# Patient Record
Sex: Male | Born: 1965 | Race: White | Hispanic: No | Marital: Single | Smoking: Current every day smoker
Health system: Southern US, Community
[De-identification: ages and names within clinical notes are randomized; demographics above are authoritative.]

## PROBLEM LIST (undated history)

## (undated) DIAGNOSIS — K7290 Hepatic failure, unspecified without coma: Secondary | ICD-10-CM

## (undated) DIAGNOSIS — I10 Essential (primary) hypertension: Secondary | ICD-10-CM

## (undated) DIAGNOSIS — B1920 Unspecified viral hepatitis C without hepatic coma: Secondary | ICD-10-CM

## (undated) DIAGNOSIS — G8929 Other chronic pain: Secondary | ICD-10-CM

## (undated) DIAGNOSIS — K7460 Unspecified cirrhosis of liver: Secondary | ICD-10-CM

## (undated) DIAGNOSIS — R188 Other ascites: Secondary | ICD-10-CM

---

## 2014-09-27 ENCOUNTER — Emergency Department (HOSPITAL_COMMUNITY)
Admission: EM | Admit: 2014-09-27 | Discharge: 2014-09-27 | Disposition: A | Discharge status: Home / Self Care | Payer: Medicare Other | Attending: Emergency Medicine | Admitting: Emergency Medicine

## 2014-09-27 ENCOUNTER — Emergency Department (INDEPENDENT_AMBULATORY_CARE_PROVIDER_SITE_OTHER)
Admission: EM | Admit: 2014-09-27 | Discharge: 2014-09-27 | Disposition: A | Discharge status: Home / Self Care | Payer: Medicare Other | Source: Home / Self Care | Attending: Family Medicine | Admitting: Family Medicine

## 2014-09-27 ENCOUNTER — Encounter (HOSPITAL_COMMUNITY): Payer: Self-pay | Admitting: Emergency Medicine

## 2014-09-27 ENCOUNTER — Encounter (HOSPITAL_COMMUNITY): Payer: Self-pay | Admitting: *Deleted

## 2014-09-27 ENCOUNTER — Emergency Department (HOSPITAL_COMMUNITY): Payer: Medicare Other

## 2014-09-27 DIAGNOSIS — R109 Unspecified abdominal pain: Secondary | ICD-10-CM

## 2014-09-27 DIAGNOSIS — Z79899 Other long term (current) drug therapy: Secondary | ICD-10-CM | POA: Diagnosis not present

## 2014-09-27 DIAGNOSIS — G8929 Other chronic pain: Secondary | ICD-10-CM

## 2014-09-27 DIAGNOSIS — R197 Diarrhea, unspecified: Secondary | ICD-10-CM | POA: Diagnosis not present

## 2014-09-27 DIAGNOSIS — K7460 Unspecified cirrhosis of liver: Secondary | ICD-10-CM | POA: Insufficient documentation

## 2014-09-27 DIAGNOSIS — K7290 Hepatic failure, unspecified without coma: Secondary | ICD-10-CM | POA: Insufficient documentation

## 2014-09-27 DIAGNOSIS — R1013 Epigastric pain: Secondary | ICD-10-CM

## 2014-09-27 DIAGNOSIS — I10 Essential (primary) hypertension: Secondary | ICD-10-CM

## 2014-09-27 DIAGNOSIS — F1193 Opioid use, unspecified with withdrawal: Secondary | ICD-10-CM

## 2014-09-27 DIAGNOSIS — F1123 Opioid dependence with withdrawal: Secondary | ICD-10-CM

## 2014-09-27 DIAGNOSIS — R188 Other ascites: Secondary | ICD-10-CM

## 2014-09-27 DIAGNOSIS — R112 Nausea with vomiting, unspecified: Secondary | ICD-10-CM

## 2014-09-27 DIAGNOSIS — B182 Chronic viral hepatitis C: Secondary | ICD-10-CM

## 2014-09-27 DIAGNOSIS — R1084 Generalized abdominal pain: Secondary | ICD-10-CM | POA: Insufficient documentation

## 2014-09-27 HISTORY — DX: Other chronic pain: G89.29

## 2014-09-27 HISTORY — DX: Essential (primary) hypertension: I10

## 2014-09-27 HISTORY — DX: Other ascites: R18.8

## 2014-09-27 HISTORY — DX: Unspecified viral hepatitis C without hepatic coma: B19.20

## 2014-09-27 HISTORY — DX: Hepatic failure, unspecified without coma: K72.90

## 2014-09-27 HISTORY — DX: Unspecified cirrhosis of liver: K74.60

## 2014-09-27 LAB — CBC WITH DIFFERENTIAL/PLATELET
Basophils Absolute: 0.1 10*3/uL (ref 0.0–0.1)
Basophils Relative: 0 % (ref 0–1)
EOS ABS: 0.2 10*3/uL (ref 0.0–0.7)
EOS PCT: 1 % (ref 0–5)
HCT: 45.6 % (ref 39.0–52.0)
HEMOGLOBIN: 15.8 g/dL (ref 13.0–17.0)
LYMPHS ABS: 4.5 10*3/uL — AB (ref 0.7–4.0)
Lymphocytes Relative: 30 % (ref 12–46)
MCH: 32 pg (ref 26.0–34.0)
MCHC: 34.6 g/dL (ref 30.0–36.0)
MCV: 92.5 fL (ref 78.0–100.0)
MONO ABS: 1.3 10*3/uL — AB (ref 0.1–1.0)
MONOS PCT: 8 % (ref 3–12)
NEUTROS ABS: 9.3 10*3/uL — AB (ref 1.7–7.7)
Neutrophils Relative %: 61 % (ref 43–77)
Platelets: 165 10*3/uL (ref 150–400)
RBC: 4.93 MIL/uL (ref 4.22–5.81)
RDW: 13.9 % (ref 11.5–15.5)
WBC: 15.4 10*3/uL — AB (ref 4.0–10.5)

## 2014-09-27 LAB — COMPREHENSIVE METABOLIC PANEL
ALBUMIN: 4.2 g/dL (ref 3.5–5.2)
ALT: 37 U/L (ref 0–53)
AST: 48 U/L — AB (ref 0–37)
Alkaline Phosphatase: 115 U/L (ref 39–117)
Anion gap: 7 (ref 5–15)
BILIRUBIN TOTAL: 1.5 mg/dL — AB (ref 0.3–1.2)
BUN: 10 mg/dL (ref 6–23)
CO2: 30 mmol/L (ref 19–32)
Calcium: 9.5 mg/dL (ref 8.4–10.5)
Chloride: 101 mEq/L (ref 96–112)
Creatinine, Ser: 0.83 mg/dL (ref 0.50–1.35)
GFR calc Af Amer: >90 mL/min (ref >90)
Glucose, Bld: 129 mg/dL — ABNORMAL HIGH (ref 70–99)
POTASSIUM: 4.7 mmol/L (ref 3.5–5.1)
Sodium: 138 mmol/L (ref 135–145)
TOTAL PROTEIN: 8.3 g/dL (ref 6.0–8.3)

## 2014-09-27 LAB — LIPASE, BLOOD: Lipase: 34 U/L (ref 11–59)

## 2014-09-27 MED ORDER — MORPHINE SULFATE 4 MG/ML IJ SOLN
4.0000 mg | Freq: Once | INTRAMUSCULAR | Status: AC
  Administered 2014-09-27: 4 mg via INTRAVENOUS
  Filled 2014-09-27: qty 1

## 2014-09-27 MED ORDER — AMLODIPINE BESYLATE 10 MG PO TABS: 10.0000 mg | ORAL_TABLET | Freq: Every day | ORAL | Status: DC

## 2014-09-27 MED ORDER — ONDANSETRON 4 MG PO TBDP: 4.0000 mg | ORAL_TABLET | Freq: Two times a day (BID) | ORAL | Status: DC | PRN

## 2014-09-27 MED ORDER — ONDANSETRON HCL 4 MG/2ML IJ SOLN
4.0000 mg | Freq: Once | INTRAMUSCULAR | Status: AC
  Administered 2014-09-27: 4 mg via INTRAVENOUS
  Filled 2014-09-27: qty 2

## 2014-09-27 MED ORDER — HYDROMORPHONE HCL 1 MG/ML IJ SOLN
1.0000 mg | Freq: Once | INTRAMUSCULAR | Status: AC
  Administered 2014-09-27: 1 mg via INTRAVENOUS
  Filled 2014-09-27: qty 1

## 2014-09-27 MED ORDER — IOHEXOL 300 MG/ML  SOLN
100.0000 mL | Freq: Once | INTRAMUSCULAR | Status: AC | PRN
  Administered 2014-09-27: 100 mL via INTRAVENOUS

## 2014-09-27 MED ORDER — OXYCODONE HCL 5 MG PO TABS: 10.0000 mg | ORAL_TABLET | Freq: Four times a day (QID) | ORAL | Status: AC | PRN
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
## 2014-09-27 NOTE — ED Notes (Signed)
Pt d/c wheelchair escorted to waiting room by this RN/
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
## 2014-09-27 NOTE — Discharge Instructions (Signed)
Thank you for coming in today.   Bellow is a list of pain management doctors.  Have your primary doctor contact and set up an appointment if possible.   [facility] 2 reviews  [facility] [address] [phone]) [phone]    [facility] 3 reviews  Pain Management Physician [address] [phone]   [facility] 4 reviews  [facility] Clinic [id number] [phone]) [phone]   [facility]: Hulda MarinPhillips Mark L MD No reviews  Pain Management Physician [address] [phone]) [phone]  [facility] 2 reviews  [facility] [address] [phone]    [facility] [place] No reviews  Pain Management Physician [address] [phone]   [facility] No reviews  Pain Control [address] [phone]   [facility] No reviews  Medical Center [address] [phone]) [phone]   [facility]: Callie FieldingBartko Albert MD 1 review  Doctor [address] [phone]   [facility] No reviews  [facility] [address] [phone]
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
## 2014-09-27 NOTE — ED Notes (Signed)
Pt. Requesting more pain medication. States morphine did not help. RN, Megan notified.

## 2014-09-27 NOTE — ED Notes (Signed)
Pt finished drinking oral contrast. CT notified.  

## 2014-09-27 NOTE — Discharge Instructions (Signed)
Abdominal (belly) pain can be caused by many things. Your caregiver performed an examination and possibly ordered blood/urine tests and imaging (CT scan, x-rays, ultrasound). Many cases can be observed and treated at home after initial evaluation in the emergency department. Even though you are being discharged home, abdominal pain can be unpredictable. Therefore, you need a repeated exam if your pain does not resolve, returns, or worsens. Most patients with abdominal pain don't have to be admitted to the hospital or have surgery, but serious problems like appendicitis and gallbladder attacks can start out as nonspecific pain. Many abdominal conditions cannot be diagnosed in one visit, so follow-up evaluations are very important. Your symptoms are likely from opiate withdrawal. Seek chronic pain management assistance using the resources given to you already. Use zofran as needed for nausea. Stay well hydrated. Use your amlodipine as directed. Call your regular doctor for an appointment this week to recheck. If you have worsening or changes in your symptoms, return to the ER. SEEK IMMEDIATE MEDICAL ATTENTION IF YOU DEVELOP ANY OF THE FOLLOWING SYMPTOMS:  The pain does not go away or becomes severe.   A temperature above 101 develops.   Repeated vomiting occurs (multiple episodes).   The pain becomes localized to portions of the abdomen. The right side could possibly be appendicitis. In an adult, the left lower portion of the abdomen could be colitis or diverticulitis.   Blood is being passed in stools or vomit (bright red or black tarry stools).   Return also if you develop chest pain, difficulty breathing, dizziness or fainting, or become confused, poorly responsive, or inconsolable (young children).  The constipation stays for more than 4 days.   There is belly (abdominal) or rectal pain.   You do not seem to be getting better.     Abdominal Pain Many things can cause belly (abdominal) pain.  Most times, the belly pain is not dangerous. Many cases of belly pain can be watched and treated at home. HOME CARE   Do not take medicines that help you go poop (laxatives) unless told to by your doctor.  Only take medicine as told by your doctor.  Eat or drink as told by your doctor. Your doctor will tell you if you should be on a special diet. GET HELP IF:  You do not know what is causing your belly pain.  You have belly pain while you are sick to your stomach (nauseous) or have runny poop (diarrhea).  You have pain while you pee or poop.  Your belly pain wakes you up at night.  You have belly pain that gets worse or better when you eat.  You have belly pain that gets worse when you eat fatty foods.  You have a fever. GET HELP RIGHT AWAY IF:   The pain does not go away within 2 hours.  You keep throwing up (vomiting).  The pain changes and is only in the right or left part of the belly.  You have bloody or tarry looking poop. MAKE SURE YOU:   Understand these instructions.  Will watch your condition.  Will get help right away if you are not doing well or get worse. Document Released: 02/25/2008 Document Revised: 09/13/2013 Document Reviewed: 05/18/2013 [facility] Patient Information 2015 [place], [place]. This information is not intended to replace advice given to you by your health care provider. Make sure you discuss any questions you have with your health care provider.  Cirrhosis Cirrhosis is a condition of scarring of the  liver which is caused when the liver has tried repairing itself following damage. This damage may come from a previous infection such as one of the forms of hepatitis (usually hepatitis C), or the damage may come from being injured by toxins. The main toxin that causes this damage is alcohol. The scarring of the liver from use of alcohol is irreversible. That means the liver cannot return to normal even though alcohol is not used any more. The main  danger of hepatitis C infection is that it may cause long-lasting (chronic) liver disease, and this also may lead to cirrhosis. This complication is progressive and irreversible. CAUSES  Prior to available blood tests, hepatitis C could be contracted by blood transfusions. Since testing of blood has improved, this is now unlikely. This infection can also be contracted through intravenous drug use and the sharing of needles. It can also be contracted through sexual relationships. The injury caused by alcohol comes from too much use. It is not a few drinks that poison the liver, but years of misuse. Usually there will be some signs and symptoms early with scarring of the liver that suggest the development of better habits. Alcohol should never be used while using acetaminophen. A small dose of both taken together may cause irreversible damage to the liver. HOME CARE INSTRUCTIONS  There is no specific treatment for cirrhosis. However, there are things you can do to avoid making the condition worse.  Rest as needed.  Eat a well-balanced diet. Your caregiver can help you with suggestions.  Vitamin supplements including vitamins A, K, D, and thiamine can help.  A low-salt diet, water restriction, or diuretic medicine may be needed to reduce fluid retention.  Avoid alcohol. This can be extremely toxic if combined with acetaminophen.  Avoid drugs which are toxic to the liver. Some of these include isoniazid, methyldopa, acetaminophen, anabolic steroids (muscle-building drugs), erythromycin, and oral contraceptives (birth control pills). Check with your caregiver to make sure medicines you are presently taking will not be harmful.  Periodic blood tests may be required. Follow your caregiver's advice regarding the timing of these.  Milk thistle is an herbal remedy which does protect the liver against toxins. However, it will not help once the liver has been scarred. SEEK MEDICAL CARE IF:  You have  increasing fatigue or weakness.  You develop swelling of the hands, feet, legs, or face.  You vomit bright red blood, or a coffee ground appearing material.  You have blood in your stools, or the stools turn black and tarry.  You have a fever.  You develop loss of appetite, or have nausea and vomiting.  You develop jaundice.  You develop easy bruising or bleeding.  You have worsening of any of the problems you are concerned about. Document Released: 09/08/2005 Document Revised: 12/01/2011 Document Reviewed: 04/26/2008 [facility] Patient Information 2015 [place], [place]. This information is not intended to replace advice given to you by your health care provider. Make sure you discuss any questions you have with your health care provider.  Diarrhea Diarrhea is frequent loose and watery bowel movements. It can cause you to feel weak and dehydrated. Dehydration can cause you to become tired and thirsty, have a dry mouth, and have decreased urination that often is dark yellow. Diarrhea is a sign of another problem, most often an infection that will not last long. In most cases, diarrhea typically lasts 2-3 days. However, it can last longer if it is a sign of something more serious. It  is important to treat your diarrhea as directed by your caregiver to lessen or prevent future episodes of diarrhea. CAUSES  Some common causes include:  Gastrointestinal infections caused by viruses, bacteria, or parasites.  Food poisoning or food allergies.  Certain medicines, such as antibiotics, chemotherapy, and laxatives.  Artificial sweeteners and fructose.  Digestive disorders. HOME CARE INSTRUCTIONS  Ensure adequate fluid intake (hydration): Have 1 cup (8 oz) of fluid for each diarrhea episode. Avoid fluids that contain simple sugars or sports drinks, fruit juices, whole milk products, and sodas. Your urine should be clear or pale yellow if you are drinking enough fluids. Hydrate with an oral  rehydration solution that you can purchase at pharmacies, retail stores, and online. You can prepare an oral rehydration solution at home by mixing the following ingredients together:   - tsp table salt.   tsp baking soda.   tsp salt substitute containing potassium chloride.  1  tablespoons sugar.  1 L (34 oz) of water.  Certain foods and beverages may increase the speed at which food moves through the gastrointestinal (GI) tract. These foods and beverages should be avoided and include:  Caffeinated and alcoholic beverages.  High-fiber foods, such as raw fruits and vegetables, nuts, seeds, and whole grain breads and cereals.  Foods and beverages sweetened with sugar alcohols, such as xylitol, sorbitol, and mannitol.  Some foods may be well tolerated and may help thicken stool including:  Starchy foods, such as rice, toast, pasta, low-sugar cereal, oatmeal, grits, baked potatoes, crackers, and bagels.  Bananas.  Applesauce.  Add probiotic-rich foods to help increase healthy bacteria in the GI tract, such as yogurt and fermented milk products.  Wash your hands well after each diarrhea episode.  Only take over-the-counter or prescription medicines as directed by your caregiver.  Take a warm bath to relieve any burning or pain from frequent diarrhea episodes. SEEK IMMEDIATE MEDICAL CARE IF:   You are unable to keep fluids down.  You have persistent vomiting.  You have blood in your stool, or your stools are black and tarry.  You do not urinate in 6-8 hours, or there is only a small amount of very dark urine.  You have abdominal pain that increases or localizes.  You have weakness, dizziness, confusion, or light-headedness.  You have a severe headache.  Your diarrhea gets worse or does not get better.  You have a fever or persistent symptoms for more than 2-3 days.  You have a fever and your symptoms suddenly get worse. MAKE SURE YOU:   Understand these  instructions.  Will watch your condition.  Will get help right away if you are not doing well or get worse. Document Released: 08/29/2002 Document Revised: 01/23/2014 Document Reviewed: 05/16/2012 [facility] Patient Information 2015 [place], [place]. This information is not intended to replace advice given to you by your health care provider. Make sure you discuss any questions you have with your health care provider.  Food Choices to Help Relieve Diarrhea When you have diarrhea, the foods you eat and your eating habits are very important. Choosing the right foods and drinks can help relieve diarrhea. Also, because diarrhea can last up to 7 days, you need to replace lost fluids and electrolytes (such as sodium, potassium, and chloride) in order to help prevent dehydration.  WHAT GENERAL GUIDELINES DO I NEED TO FOLLOW?  Slowly drink 1 cup (8 oz) of fluid for each episode of diarrhea. If you are getting enough fluid, your urine will be clear  or pale yellow.  Eat starchy foods. Some good choices include white rice, white toast, pasta, low-fiber cereal, baked potatoes (without the skin), saltine crackers, and bagels.  Avoid large servings of any cooked vegetables.  Limit fruit to two servings per day. A serving is  cup or 1 small piece.  Choose foods with less than 2 g of fiber per serving.  Limit fats to less than 8 tsp (38 g) per day.  Avoid fried foods.  Eat foods that have probiotics in them. Probiotics can be found in certain dairy products.  Avoid foods and beverages that may increase the speed at which food moves through the stomach and intestines (gastrointestinal tract). Things to avoid include:  High-fiber foods, such as dried fruit, raw fruits and vegetables, nuts, seeds, and whole grain foods.  Spicy foods and high-fat foods.  Foods and beverages sweetened with high-fructose corn syrup, honey, or sugar alcohols such as xylitol, sorbitol, and mannitol. WHAT FOODS ARE  RECOMMENDED? Grains White rice. White, [place], or pita breads (fresh or toasted), including plain rolls, buns, or bagels. White pasta. Saltine, soda, or graham crackers. Pretzels. Low-fiber cereal. Cooked cereals made with water (such as cornmeal, farina, or cream cereals). Plain muffins. Matzo. Melba toast. Zwieback.  Vegetables Potatoes (without the skin). Strained tomato and vegetable juices. Most well-cooked and canned vegetables without seeds. Tender lettuce. Fruits Cooked or canned applesauce, apricots, cherries, fruit cocktail, grapefruit, peaches, pears, or plums. Fresh bananas, apples without skin, cherries, grapes, cantaloupe, grapefruit, peaches, oranges, or plums.  Meat and Other Protein Products Baked or boiled chicken. Eggs. Tofu. Fish. Seafood. Smooth peanut butter. Ground or well-cooked tender beef, ham, veal, lamb, pork, or poultry.  Dairy Plain yogurt, kefir, and unsweetened liquid yogurt. Lactose-free milk, buttermilk, or soy milk. Plain hard cheese. Beverages Sport drinks. Clear broths. Diluted fruit juices (except prune). Regular, caffeine-free sodas such as ginger ale. Water. Decaffeinated teas. Oral rehydration solutions. Sugar-free beverages not sweetened with sugar alcohols. Other Bouillon, broth, or soups made from recommended foods.  The items listed above may not be a complete list of recommended foods or beverages. Contact your dietitian for more options. WHAT FOODS ARE NOT RECOMMENDED? Grains Whole grain, whole wheat, bran, or rye breads, rolls, pastas, crackers, and cereals. Wild or brown rice. Cereals that contain more than 2 g of fiber per serving. Corn tortillas or taco shells. Cooked or dry oatmeal. Granola. Popcorn. Vegetables Raw vegetables. Cabbage, broccoli, Brussels sprouts, artichokes, baked beans, beet greens, corn, kale, legumes, peas, sweet potatoes, and yams. Potato skins. Cooked spinach and cabbage. Fruits Dried fruit, including raisins and dates.  Raw fruits. Stewed or dried prunes. Fresh apples with skin, apricots, mangoes, pears, raspberries, and strawberries.  Meat and Other Protein Products Chunky peanut butter. Nuts and seeds. Beans and lentils. Tomasa BlaseBacon.  Dairy High-fat cheeses. Milk, chocolate milk, and beverages made with milk, such as milk shakes. Cream. Ice cream. Sweets and Desserts Sweet rolls, doughnuts, and sweet breads. Pancakes and waffles. Fats and Oils Butter. Cream sauces. Margarine. Salad oils. Plain salad dressings. Olives. Avocados.  Beverages Caffeinated beverages (such as coffee, tea, soda, or energy drinks). Alcoholic beverages. Fruit juices with pulp. Prune juice. Soft drinks sweetened with high-fructose corn syrup or sugar alcohols. Other Coconut. Hot sauce. Chili powder. Mayonnaise. Gravy. Cream-based or milk-based soups.  The items listed above may not be a complete list of foods and beverages to avoid. Contact your dietitian for more information. WHAT SHOULD I DO IF I BECOME DEHYDRATED? Diarrhea can sometimes lead  to dehydration. Signs of dehydration include dark urine and dry mouth and skin. If you think you are dehydrated, you should rehydrate with an oral rehydration solution. These solutions can be purchased at pharmacies, retail stores, or online.  Drink -1 cup (120-240 mL) of oral rehydration solution each time you have an episode of diarrhea. If drinking this amount makes your diarrhea worse, try drinking smaller amounts more often. For example, drink 1-3 tsp (5-15 mL) every 5-10 minutes.  A general rule for staying hydrated is to drink 1-2 L of fluid per day. Talk to your health care provider about the specific amount you should be drinking each day. Drink enough fluids to keep your urine clear or pale yellow. Document Released: 11/29/2003 Document Revised: 09/13/2013 Document Reviewed: 08/01/2013 [facility] Patient Information 2015 [place], [place]. This information is not intended to replace advice given  to you by your health care provider. Make sure you discuss any questions you have with your health care provider.  Opioid Withdrawal Opioids are a group of narcotic drugs. They include the Zoanne Newill drug heroin. They also include pain medicines, such as morphine, hydrocodone, oxycodone, and fentanyl. Opioid withdrawal is a group of characteristic physical and mental signs and symptoms. It typically occurs if you have been using opioids daily for several weeks or longer and stop using or rapidly decrease use. Opioid withdrawal can also occur if you have used opioids daily for a long time and are given a medicine to block the effect.  SIGNS AND SYMPTOMS Opioid withdrawal includes three or more of the following symptoms:   Depressed, anxious, or irritable mood.  Nausea or vomiting.  Muscle aches or spasms.   Watery eyes.   Runny nose.  Dilated pupils, sweating, or hairs standing on end.  Diarrhea or intestinal cramping.  Yawning.   Fever.  Increased blood pressure.  Fast pulse.  Restlessness or trouble sleeping. These signs and symptoms occur within several hours of stopping or reducing short-acting opioids, such as heroin. They can occur within 3 days of stopping or reducing long-acting opioids, such as methadone. Withdrawal begins within minutes of receiving a drug that blocks the effects of opioids, such as naltrexone or naloxone. DIAGNOSIS  Opioid use disorder is diagnosed by your health care provider. You will be asked about your symptoms, drug and alcohol use, medical history, and use of medicines. A physical exam may be done. Lab tests may be ordered. Your health care provider may have you see a mental health professional.  TREATMENT  The treatment for opioid withdrawal is usually provided by medical doctors with special training in substance use disorders (addiction specialists). The following medicines may be included in treatment:  Opioids given in place of the abused  opioid. They turn on opioid receptors in the brain and lessen or prevent withdrawal symptoms. They are gradually decreased (opioid substitution and taper).  Non-opioids that can lessen certain opioid withdrawal symptoms. They may be used alone or with opioid substitution and taper. Successful long-term recovery usually requires medicine, counseling, and group support. HOME CARE INSTRUCTIONS   Take medicines only as directed by your health care provider.  Check with your health care provider before starting new medicines.  Keep all follow-up visits as directed by your health care provider. SEEK MEDICAL CARE IF:  You are not able to take your medicines as directed.  Your symptoms get worse.  You relapse. SEEK IMMEDIATE MEDICAL CARE IF:  You have serious thoughts about hurting yourself or others.  You have a  seizure.  You lose consciousness. Document Released: 09/11/2003 Document Revised: 01/23/2014 Document Reviewed: 09/21/2013 [facility] Patient Information 2015 [place], [place]. This information is not intended to replace advice given to you by your health care provider. Make sure you discuss any questions you have with your health care provider.

## 2014-09-27 NOTE — ED Notes (Signed)
Pt in c/o abd pain that is chronic, states he has had trouble with it for 20 years- a few days ago he had to leave home without his normal medications which caused increased pain, no distress noted
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
## 2014-10-30 ENCOUNTER — Encounter: Payer: Self-pay | Admitting: Family Medicine

## 2014-10-30 ENCOUNTER — Ambulatory Visit (INDEPENDENT_AMBULATORY_CARE_PROVIDER_SITE_OTHER): Payer: Medicare Other | Admitting: Family Medicine

## 2014-10-30 VITALS — BP 148/79 | HR 90 | Temp 98.1°F | Ht 72.0 in | Wt 278.0 lb

## 2014-10-30 DIAGNOSIS — R7309 Other abnormal glucose: Secondary | ICD-10-CM

## 2014-10-30 DIAGNOSIS — Z0000 Encounter for general adult medical examination without abnormal findings: Secondary | ICD-10-CM

## 2014-10-30 DIAGNOSIS — Z720 Tobacco use: Secondary | ICD-10-CM | POA: Insufficient documentation

## 2014-10-30 DIAGNOSIS — R739 Hyperglycemia, unspecified: Secondary | ICD-10-CM

## 2014-10-30 DIAGNOSIS — R600 Localized edema: Secondary | ICD-10-CM

## 2014-10-30 DIAGNOSIS — K219 Gastro-esophageal reflux disease without esophagitis: Secondary | ICD-10-CM

## 2014-10-30 DIAGNOSIS — G4733 Obstructive sleep apnea (adult) (pediatric): Secondary | ICD-10-CM

## 2014-10-30 DIAGNOSIS — I10 Essential (primary) hypertension: Secondary | ICD-10-CM | POA: Insufficient documentation

## 2014-10-30 DIAGNOSIS — M1990 Unspecified osteoarthritis, unspecified site: Secondary | ICD-10-CM

## 2014-10-30 DIAGNOSIS — R7303 Prediabetes: Secondary | ICD-10-CM

## 2014-10-30 LAB — COMPREHENSIVE METABOLIC PANEL
ALBUMIN: 4.5 g/dL (ref 3.5–5.2)
ALT: 40 U/L (ref 0–53)
AST: 51 U/L — ABNORMAL HIGH (ref 0–37)
Alkaline Phosphatase: 96 U/L (ref 39–117)
BILIRUBIN TOTAL: 0.7 mg/dL (ref 0.2–1.2)
BUN: 19 mg/dL (ref 6–23)
CO2: 30 mEq/L (ref 19–32)
Calcium: 10.2 mg/dL (ref 8.4–10.5)
Chloride: 98 mEq/L (ref 96–112)
Creat: 0.85 mg/dL (ref 0.50–1.35)
Glucose, Bld: 125 mg/dL — ABNORMAL HIGH (ref 70–99)
POTASSIUM: 5.3 meq/L (ref 3.5–5.3)
Sodium: 135 mEq/L (ref 135–145)
TOTAL PROTEIN: 8.2 g/dL (ref 6.0–8.3)

## 2014-10-30 LAB — CBC
HEMATOCRIT: 48.9 % (ref 39.0–52.0)
HEMOGLOBIN: 17 g/dL (ref 13.0–17.0)
MCH: 31.5 pg (ref 26.0–34.0)
MCHC: 34.8 g/dL (ref 30.0–36.0)
MCV: 90.6 fL (ref 78.0–100.0)
MPV: 11.4 fL (ref 8.6–12.4)
Platelets: 198 10*3/uL (ref 150–400)
RBC: 5.4 MIL/uL (ref 4.22–5.81)
RDW: 14.2 % (ref 11.5–15.5)
WBC: 13.7 10*3/uL — ABNORMAL HIGH (ref 4.0–10.5)

## 2014-10-30 LAB — POCT GLYCOSYLATED HEMOGLOBIN (HGB A1C): Hemoglobin A1C: 5.6

## 2014-10-30 LAB — LIPID PANEL
CHOL/HDL RATIO: 3.4 ratio
CHOLESTEROL: 140 mg/dL (ref 0–200)
HDL: 41 mg/dL (ref >39)
LDL Cholesterol: 78 mg/dL (ref 0–99)
Triglycerides: 107 mg/dL (ref <150)
VLDL: 21 mg/dL (ref 0–40)

## 2014-10-30 MED ORDER — SPIRONOLACTONE 25 MG PO TABS: 25.0000 mg | ORAL_TABLET | Freq: Every day | ORAL | Status: DC

## 2014-10-30 NOTE — Assessment & Plan Note (Signed)
A: Likely related to patient's cirrhosis, though can also consider CHF (JVD on exam and PND, but no orthopnea), or side effect of amlodipine.   P: Will refer to cardiology for echocardiogram to rule out CHF. Will additional start spironolactone today. Will check CMP today and recheck potassium in 2 weeks. If not improving, can consider changing amlodipine to other antihypertensive.

## 2014-10-30 NOTE — Assessment & Plan Note (Signed)
Will re-address at future appointment and discuss referral for sleep study.

## 2014-10-30 NOTE — Progress Notes (Signed)
Patrick Henderson is a 49 y.o. male who presents to the [facility] today to establish care. His concerns today include:  HPI:  Bilateral Leg Swelling Started around a year ago, was previously told it was related to his cirrhosis. Has worsened over the past year. Tried propping up feet, which has helped some. Has been on lasix in the past, but does not remember how much or if it helped with his swelling. No orthopnea. Sometimes wakes up in the middle of the night gasping for air, though attributes it to sleep apnea (see below problem). No cough. No chest pain.  OSA. No prior sleep study performed. Patient states that he feels unrested throughout the day. Wakes up in the middle of the night gasping for air.  Tobacco Abuse: Current smoker, down to 2 cigarettes per day. Actively trying to quit.   Hypertension BP Readings from Last 3 Encounters:  10/30/14 148/79  09/27/14 162/89  09/27/14 179/134   Home BP monitoring-No Compliant with medications-yes without side effects ROS-Denies any CP, HA, SOB, blurry vision, transient weakness, orthopnea,    ROS: As per HPI, otherwise all systems reviewed and are negative.  Past Medical History - Reviewed and updated Patient Active Problem List   Diagnosis Date Noted  . Arthritis 10/30/2014  . Prediabetes 10/30/2014  . GERD (gastroesophageal reflux disease) 10/30/2014  . Hypertension 10/30/2014  . Obstructive sleep apnea 10/30/2014  . Healthcare maintenance 10/30/2014  . Tobacco abuse 10/30/2014  . Bilateral leg edema 10/30/2014  . Hepatic cirrhosis due to chronic hepatitis C infection 09/27/2014  . Chronic pain 09/27/2014    Medications- reviewed and updated Current Outpatient Prescriptions  Medication Sig Dispense Refill  . amLODipine (NORVASC) 10 MG tablet Take 10 mg by mouth at bedtime.    Marland Kitchen esomeprazole (NEXIUM) 40 MG capsule Take 40 mg by mouth 2 (two) times daily before a meal.    . LACTULOSE PO Take by mouth 4 (four) times daily.    .  OxyCODONE (OXYCONTIN) 80 mg T12A 12 hr tablet Take 80 mg by mouth 3 (three) times daily.    Marland Kitchen oxycodone (ROXICODONE) 30 MG immediate release tablet Take 30 mg by mouth 4 (four) times daily as needed for pain.    Marland Kitchen oxyCODONE (ROXICODONE) 5 MG immediate release tablet Take 2 tablets (10 mg total) by mouth every 6 (six) hours as needed for severe pain. 20 tablet 0  . spironolactone (ALDACTONE) 25 MG tablet Take 1 tablet (25 mg total) by mouth daily. 30 tablet 3   No current facility-administered medications for this visit.    Objective: Physical Exam: BP 148/79 mmHg  Pulse 90  Temp(Src) 98.1 F (36.7 C) (Oral)  Ht 6' (1.829 m)  Wt 278 lb (126.1 kg)  BMI 37.70 kg/m2  Gen: NAD, resting comfortably, sitting on hospital bed HEENT: 4cm JVD noted above clavicle CV: Distant heart sounds, RRR with no murmurs appreciated Lungs: NWOB, CTAB with no crackles, wheezes, or rhonchi Abdomen: +BS, distended, mildly tender diffusely, no rebound or guarding Skin: warm, dry EXT: 3+ pitting edema in LE bilaterally to knees, venous stasis changes (hyperpigmentation, scleroderma) noted.  Neuro: grossly normal, moves all extremities  A/P: See problem list  Bilateral leg edema A: Likely related to patient's cirrhosis, though can also consider CHF (JVD on exam and PND, but no orthopnea), or side effect of amlodipine.   P: Will refer to cardiology for echocardiogram to rule out CHF. Will additional start spironolactone today. Will check CMP today and recheck potassium  in 2 weeks. If not improving, can consider changing amlodipine to other antihypertensive.    Prediabetes Will check POCT A1c today and potentially start treatment if indicated. Patient will follow up in 2 weeks.    Hypertension Will continue amlodipine today. Will also start Spironolactone today. Can consider changing agents pending results of echo, A1c, and if edema persists.   Will obtain lipid panel and CMP today.    Obstructive sleep  apnea Will re-address at future appointment and discuss referral for sleep study.      Orders Placed This Encounter  Procedures  . Lipid Panel  . CBC  . Comprehensive metabolic panel  . Ambulatory referral to Cardiology    Referral Priority:  Routine    Referral Type:  Consultation    Referral Reason:  Specialty Services Required    Requested Specialty:  Cardiology    Number of Visits Requested:  1  . POCT A1C  . 2D Echocardiogram without contrast    Bilateral leg swelling    Standing Status: Future     Number of Occurrences:      Standing Expiration Date: 10/31/2015    Order Specific Question:  Type of Echo    Answer:  Complete    Order Specific Question:  Where should this test be performed    Answer:  Other    Order Specific Question:  Reason for exam-Echo    Answer:  Other - See Comments Section    Meds ordered this encounter  Medications  . spironolactone (ALDACTONE) 25 MG tablet    Sig: Take 1 tablet (25 mg total) by mouth daily.    Dispense:  30 tablet    Refill:  3     Anitha Kreiser M. Jimmey RalphParker, MD [facility] Resident PGY-1 10/30/2014 11:17 AM

## 2014-10-30 NOTE — Assessment & Plan Note (Signed)
Will continue amlodipine today. Will also start Spironolactone today. Can consider changing agents pending results of echo, A1c, and if edema persists.   Will obtain lipid panel and CMP today.

## 2014-10-30 NOTE — Assessment & Plan Note (Addendum)
Will check POCT A1c today and potentially start treatment if indicated. Patient will follow up in 2 weeks.

## 2014-10-30 NOTE — Patient Instructions (Signed)
Thank you for coming to the clinic today. It was nice seeing you.  For for your foot swelling, please take spironolactone once daily. This medication will also help with the swelling in your belly. The most common side effects of this medication are increasing your potassium levels. Please come back in 2 weeks so that we can recheck this. This medication has also been known to cause enlargement of your breasts. If this happens, please let us know so that we can stop the medication.  We will refer you to a cardiologist to get an echocardiogram to make sure your heart is functioning well.  We will also check your blood sugar, cholesterol, blood counts, and electrolytes today.,  See you again in 2 weeks.

## 2014-10-30 NOTE — Progress Notes (Signed)
[facility] ATTENDING  NOTE Patrick Nienhuis,MD  I have discussed this patient with the resident. I agree with the resident's findings, assessment and care plan.   

## 2014-11-13 ENCOUNTER — Ambulatory Visit: Payer: Medicare Other | Admitting: Family Medicine

## 2014-11-15 ENCOUNTER — Other Ambulatory Visit (HOSPITAL_COMMUNITY): Payer: Self-pay | Admitting: Family Medicine

## 2014-11-15 ENCOUNTER — Ambulatory Visit (HOSPITAL_COMMUNITY): Payer: Medicare Other | Attending: Family Medicine

## 2014-11-15 ENCOUNTER — Encounter: Payer: Self-pay | Admitting: Family Medicine

## 2014-11-15 DIAGNOSIS — I10 Essential (primary) hypertension: Secondary | ICD-10-CM | POA: Insufficient documentation

## 2014-11-15 DIAGNOSIS — R600 Localized edema: Secondary | ICD-10-CM | POA: Diagnosis present

## 2014-11-15 DIAGNOSIS — Z720 Tobacco use: Secondary | ICD-10-CM | POA: Diagnosis not present

## 2014-11-15 NOTE — Progress Notes (Signed)
2D Echo completed. 11/15/2014 

## 2014-11-22 ENCOUNTER — Ambulatory Visit (INDEPENDENT_AMBULATORY_CARE_PROVIDER_SITE_OTHER): Payer: Medicare Other | Admitting: Family Medicine

## 2014-11-22 ENCOUNTER — Encounter: Payer: Self-pay | Admitting: Family Medicine

## 2014-11-22 VITALS — BP 158/98 | HR 101 | Temp 98.0°F | Wt 287.2 lb

## 2014-11-22 DIAGNOSIS — R600 Localized edema: Secondary | ICD-10-CM

## 2014-11-22 DIAGNOSIS — M79601 Pain in right arm: Secondary | ICD-10-CM

## 2014-11-22 DIAGNOSIS — I10 Essential (primary) hypertension: Secondary | ICD-10-CM

## 2014-11-22 DIAGNOSIS — G8929 Other chronic pain: Secondary | ICD-10-CM

## 2014-11-22 MED ORDER — SPIRONOLACTONE 25 MG PO TABS: 25.0000 mg | ORAL_TABLET | Freq: Every day | ORAL | Status: AC

## 2014-11-22 NOTE — Patient Instructions (Signed)
Thank you for coming to the clinic today. It was nice seeing you.  For your leg swelling, your echocardiogram was normal. The swelling in your leg is most likely due to your cirrhosis. We will start spironolactone today. This medication can cause breast swelling. If this happens, please let us know. We will recheck your potassium in 1-2 weeks.  We do not need to start a medication for your cholesterol today.  For your arm pain, continue to take naproxen as needed. I do not think that you have any nerve damage. We will refer you to physical therapy.  Please schedule a separate appointment to discuss your pain.  Your blood pressure was slightly elevated today. The spironolactone will help with this.  Please come back in 1-2 weeks for a blood pressure check and lab draw.

## 2014-11-22 NOTE — Assessment & Plan Note (Addendum)
BP elevated today, though patient has not taken amlodipine. Encouraged compliance today. Will also re-send spironolactone which will improve his BP. PAtient to return in 1-2 weeks for BP recheck and repeat BMP.   LDL 78. Not a candidate for statin given cirrhosis. Will not start lipid lowering agent at this time.

## 2014-11-22 NOTE — Progress Notes (Signed)
Patrick ChattersRoger Henderson is a 49 y.o. male who [facility] for HTN follow up. His concerns today include:  HPI:  Hypertension Patient endorses missing a few doses of amlodipine. Has not taken amlodipine today.  BP Readings from Last 3 Encounters:  11/22/14 158/98  10/30/14 148/79  09/27/14 162/89   Home BP monitoring-No ROS-Denies any CP, HA, SOB, blurry vision, transient weakness, orthopnea, PND.    Lower extremity edema Edema the same as last visit. Had normal echocardiogram last month. Did not start spironolactone as prescribed at last visit. States that pharmacy never received it.   Right Elbow/Arm pain Patient reports right hand and elbow pain for the past several months. States that he broke hist right hand 3 months ago, but never had it "set." Only went to the ED where it was confirmed to have a fracture. Pain is intermittent. Described as achy and sharp. Radiates along the ulnar aspect of his right hand to his elbow. Reports some decreased strength - states that he was unable to pick up glass of tea this morning.   ROS: As per HPI, otherwise all systems reviewed and are negative.  Past Medical History - Reviewed and updated Patient Active Problem List   Diagnosis Date Noted  . Right arm pain 11/22/2014  . Arthritis 10/30/2014  . Prediabetes 10/30/2014  . GERD (gastroesophageal reflux disease) 10/30/2014  . Hypertension 10/30/2014  . Obstructive sleep apnea 10/30/2014  . Healthcare maintenance 10/30/2014  . Tobacco abuse 10/30/2014  . Bilateral leg edema 10/30/2014  . Hepatic cirrhosis due to chronic hepatitis C infection 09/27/2014  . Chronic pain 09/27/2014    Medications- reviewed and updated Current Outpatient Prescriptions  Medication Sig Dispense Refill  . amLODipine (NORVASC) 10 MG tablet Take 10 mg by mouth at bedtime.    Marland Kitchen. esomeprazole (NEXIUM) 40 MG capsule Take 40 mg by mouth 2 (two) times daily before a meal.    . LACTULOSE PO Take by mouth 4  (four) times daily.    . OxyCODONE (OXYCONTIN) 80 mg T12A 12 hr tablet Take 80 mg by mouth 3 (three) times daily.    Marland Kitchen. oxycodone (ROXICODONE) 30 MG immediate release tablet Take 30 mg by mouth 4 (four) times daily as needed for pain.    Marland Kitchen. oxyCODONE (ROXICODONE) 5 MG immediate release tablet Take 2 tablets (10 mg total) by mouth every 6 (six) hours as needed for severe pain. 20 tablet 0  . spironolactone (ALDACTONE) 25 MG tablet Take 1 tablet (25 mg total) by mouth daily. 30 tablet 3   No current facility-administered medications for this visit.    Objective: Physical Exam: BP 158/98 mmHg  Pulse 101  Temp(Src) 98 F (36.7 C) (Oral)  Wt 287 lb 4 oz (130.296 kg)  Gen: NAD, resting comfortably CV: RRR with no murmurs appreciated Lungs: NWOB, CTAB with no crackles, wheezes, or rhonchi Abdomen: Normal bowel sounds present. Soft, Nontender, Nondistended. Ext: Right hand atraumatic. Good ROM. Nontender to palpation. 5/5 lumbrical strength in right and left hand. 5/5 grip strength. 5/5 wrist flexion and extension. Sensation to light touch intact bilaterally. 2+ edema to knees bilaterally Skin: warm, dry Neuro: grossly normal, moves all extremities  A/P: See problem list  Hypertension BP elevated today, though patient has not taken amlodipine. Encouraged compliance today. Will also re-send spironolactone which will improve his BP. PAtient to return in 1-2 weeks for BP recheck and repeat BMP.   LDL 78. Not a candidate for statin given cirrhosis. Will not  start lipid lowering agent at this time.    Bilateral leg edema Echo normal. Edema likely secondary to cirrhosis. Will start spironolactone today and follow up in 1-2 weeks.    Right arm pain Normal strength and sensation in right hand. May have decreased dexterity related to recent fracture. Will treat conservatively at this point with NSAIDs as needed and physical therapy.    Chronic pain Instructed patient to make appointment solely  dedicated to pain management - patient recently moved and will need new provider for his chronic pain.      Orders Placed This Encounter  Procedures  . Basic metabolic panel    Standing Status: Future     Number of Occurrences:      Standing Expiration Date: 11/22/2015  . Ambulatory referral to Physical Therapy    Referral Priority:  Routine    Referral Type:  Physical Medicine    Referral Reason:  Specialty Services Required    Requested Specialty:  Physical Therapy    Number of Visits Requested:  1    Meds ordered this encounter  Medications  . spironolactone (ALDACTONE) 25 MG tablet    Sig: Take 1 tablet (25 mg total) by mouth daily.    Dispense:  30 tablet    Refill:  3     Patrick Henderson M. Jimmey Ralph, MD [facility] Family Medicine Resident PGY-1 11/22/2014 5:51 PM

## 2014-11-22 NOTE — Assessment & Plan Note (Signed)
Instructed patient to make appointment solely dedicated to pain management - patient recently moved and will need new provider for his chronic pain.

## 2014-11-22 NOTE — Assessment & Plan Note (Signed)
Echo normal. Edema likely secondary to cirrhosis. Will start spironolactone today and follow up in 1-2 weeks.

## 2014-11-22 NOTE — Assessment & Plan Note (Signed)
Normal strength and sensation in right hand. May have decreased dexterity related to recent fracture. Will treat conservatively at this point with NSAIDs as needed and physical therapy.

## 2014-12-19 ENCOUNTER — Ambulatory Visit: Payer: Medicare Other | Admitting: Family Medicine

## 2014-12-19 ENCOUNTER — Ambulatory Visit: Payer: Medicare Other | Admitting: Interventional Cardiology

## 2015-06-04 ENCOUNTER — Telehealth: Payer: Self-pay | Admitting: *Deleted

## 2015-06-04 ENCOUNTER — Emergency Department (HOSPITAL_COMMUNITY)
Admission: EM | Admit: 2015-06-04 | Discharge: 2015-06-04 | Disposition: A | Discharge status: Home / Self Care | Payer: Medicare Other | Attending: Physician Assistant | Admitting: Physician Assistant

## 2015-06-04 ENCOUNTER — Encounter (HOSPITAL_COMMUNITY): Payer: Self-pay | Admitting: Emergency Medicine

## 2015-06-04 DIAGNOSIS — Z8619 Personal history of other infectious and parasitic diseases: Secondary | ICD-10-CM | POA: Diagnosis not present

## 2015-06-04 DIAGNOSIS — Z8719 Personal history of other diseases of the digestive system: Secondary | ICD-10-CM | POA: Insufficient documentation

## 2015-06-04 DIAGNOSIS — Z720 Tobacco use: Secondary | ICD-10-CM | POA: Diagnosis not present

## 2015-06-04 DIAGNOSIS — G8929 Other chronic pain: Secondary | ICD-10-CM

## 2015-06-04 DIAGNOSIS — Z79899 Other long term (current) drug therapy: Secondary | ICD-10-CM | POA: Insufficient documentation

## 2015-06-04 DIAGNOSIS — I10 Essential (primary) hypertension: Secondary | ICD-10-CM | POA: Insufficient documentation

## 2015-06-04 DIAGNOSIS — R1084 Generalized abdominal pain: Secondary | ICD-10-CM | POA: Diagnosis present

## 2015-06-04 NOTE — Discharge Instructions (Signed)
Supplement your chronic pain medication with ibuprofen as needed for pain. Follow up with your pain clinic doctor and your regular doctor for ongoing management of your chronic pain. Return to the ER for changes or worsening symptoms.   Chronic Back Pain  When back pain lasts longer than 3 months, it is called chronic back pain.People with chronic back pain often go through certain periods that are more intense (flare-ups).  CAUSES Chronic back pain can be caused by wear and tear (degeneration) on different structures in your back. These structures include:  The bones of your spine (vertebrae) and the joints surrounding your spinal cord and nerve roots (facets).  The strong, fibrous tissues that connect your vertebrae (ligaments). Degeneration of these structures may result in pressure on your nerves. This can lead to constant pain. HOME CARE INSTRUCTIONS  Avoid bending, heavy lifting, prolonged sitting, and activities which make the problem worse.  Take brief periods of rest throughout the day to reduce your pain. Lying down or standing usually is better than sitting while you are resting.  Take over-the-counter or prescription medicines only as directed by your caregiver. SEEK IMMEDIATE MEDICAL CARE IF:   You have weakness or numbness in one of your legs or feet.  You have trouble controlling your bladder or bowels.  You have nausea, vomiting, abdominal pain, shortness of breath, or fainting. Document Released: 10/16/2004 Document Revised: 12/01/2011 Document Reviewed: 08/23/2011 [facility] Patient Information 2015 [place], [place]. This information is not intended to replace advice given to you by your health care provider. Make sure you discuss any questions you have with your health care provider.

## 2015-06-04 NOTE — ED Provider Notes (Signed)
CSN: [id number]     Arrival date & time 06/04/15  4098 History  This chart was scribed for non-physician practitioner Allen Derry, PA-C working with Abelino Derrick, MD by Lyndel Safe, ED Scribe. This patient was seen in room TR10C/TR10C and the patient's care was started at 11:22 AM.    Chief Complaint  Patient presents with  . generalized pain    The history is provided by the patient. No language interpreter was used.   HPI Comments: Patrick Henderson is a 49 y.o. male, with a PMHx of hepatitis C, HTN, liver failure, and cirrhosis, who presents to the Emergency Department complaining of constant, cramping, 4/10, generalized pain that is chronic, worsened with movements, pt takes  oxycodone TID and  OxyContin TID with mild relief. He has an appointment with pain management in 4 days; he reports not having enough pain medication to make it until his appointment on Thursday and presents to the ER for additional medications. Denies new injuries or changes in his chronic pain. Has some mild chronic abd pain which is unchanged. Pt denies fevers, chills, nausea, vomiting, diarrhea, constipation, blood in stool, melena, CP, SOB, hematuria, dysuria, numbness, tingling or weakness.   Past Medical History  Diagnosis Date  . Hepatitis C   . Hypertension   . Ascites   . Liver failure   . Cirrhosis   . Chronic pain    History reviewed. No pertinent past surgical history. Family History  Problem Relation Age of Onset  . Heart disease Father   . Cancer Paternal Grandmother     Lung  . Cancer Paternal Grandfather     Intestinal   Social History  Substance Use Topics  . Smoking status: Current Every Day Smoker -- 0.20 packs/day    Types: Cigarettes  . Smokeless tobacco: None     Comment: working on quitting (2 a day)  . Alcohol Use: 0.0 oz/week    0 Standard drinks or equivalent per week     Comment: socially    Review of Systems  Constitutional: Negative for fever  and chills.  Respiratory: Negative for shortness of breath.   Cardiovascular: Negative for chest pain.  Gastrointestinal: Negative for nausea, vomiting, abdominal pain (chronic unchanged), diarrhea, constipation and blood in stool.  Genitourinary: Negative for dysuria and hematuria.  Musculoskeletal: Positive for myalgias, back pain (chronic) and arthralgias.  Skin: Negative for rash.  Allergic/Immunologic: Negative for immunocompromised state.  Neurological: Negative for weakness and numbness.  Psychiatric/Behavioral: Negative for confusion.  A complete 10 system review of systems was obtained and is otherwise negative except at noted in the HPI and PMH.  Allergies  Levaquin  Home Medications   Prior to Admission medications   Medication Sig Start Date End Date Taking? Authorizing Provider  amLODipine (NORVASC) 10 MG tablet Take 10 mg by mouth at bedtime.    Historical Provider, MD  esomeprazole (NEXIUM) 40 MG capsule Take 40 mg by mouth 2 (two) times daily before a meal.    Historical Provider, MD  LACTULOSE PO Take by mouth 4 (four) times daily.    Historical Provider, MD  OxyCODONE (OXYCONTIN) 80 mg T12A 12 hr tablet Take 80 mg by mouth 3 (three) times daily.    Historical Provider, MD  oxycodone (ROXICODONE) 30 MG immediate release tablet Take 30 mg by mouth 4 (four) times daily as needed for pain.    Historical Provider, MD  oxyCODONE (ROXICODONE) 5 MG immediate release tablet Take 2 tablets (10 mg total) by mouth  every 6 (six) hours as needed for severe pain. 09/27/14   Takerra Lupinacci Camprubi-Soms, PA-C  spironolactone (ALDACTONE) 25 MG tablet Take 1 tablet (25 mg total) by mouth daily. 11/22/14   Ardith Dark, MD   BP 153/100 mmHg  Pulse 86  Temp(Src) 97.9 F (36.6 C) (Oral)  Resp 18  Ht 6' (1.829 m)  Wt 298 lb (135.172 kg)  BMI 40.41 kg/m2  SpO2 96% Physical Exam  Constitutional: He is oriented to person, place, and time. Vital signs are normal. He appears well-developed and  well-nourished.  Non-toxic appearance. No distress.  Afebrile, nontoxic, NAD  HENT:  Head: Normocephalic and atraumatic.  Mouth/Throat: Oropharynx is clear and moist and mucous membranes are normal.  Eyes: Conjunctivae and EOM are normal. Right eye exhibits no discharge. Left eye exhibits no discharge.  Neck: Normal range of motion. Neck supple.  Cardiovascular: Normal rate, regular rhythm, normal heart sounds and intact distal pulses.  Exam reveals no gallop and no friction rub.   No murmur heard. Pulmonary/Chest: Effort normal and breath sounds normal. No respiratory distress. He has no decreased breath sounds. He has no wheezes. He has no rhonchi. He has no rales.  Abdominal: Soft. Normal appearance and bowel sounds are normal. He exhibits no distension. There is no tenderness. There is no rigidity, no rebound, no guarding, no CVA tenderness, no tenderness at McBurney's point and negative Murphy's sign.  Musculoskeletal: Normal range of motion.  MAE x4 Strength and sensation grossly intact Distal pulses intact Gait steady  Neurological: He is alert and oriented to person, place, and time. He has normal strength. No sensory deficit.  Skin: Skin is warm, dry and intact. No rash noted.  Psychiatric: He has a normal mood and affect.  Nursing note and vitals reviewed.   ED Course  Procedures  DIAGNOSTIC STUDIES: Oxygen Saturation is 96% on RA, adequate by my interpretation.    COORDINATION OF CARE: 11:24 AM Discussed treatment plan with pt. Pt acknowledges and agrees to plan.   Labs Review Labs Reviewed - No data to display  Imaging Review No results found. I have personally reviewed and evaluated these images and lab results as part of my medical decision-making.   EKG Interpretation None      MDM   Final diagnoses:  Chronic pain    49 y.o. male here with chronic pain. All extremities NVI with no acute injuries. Is running out of his chronic narcotics and doesn't have  appt until Thurs. Discussed that we cannot provide chronic narcotics. Will have him f/up with PCP and pain clinic. I explained the diagnosis and have given explicit precautions to return to the ER including for any other new or worsening symptoms. The patient understands and accepts the medical plan as it's been dictated and I have answered their questions. Discharge instructions concerning home care and prescriptions have been given. The patient is STABLE and is discharged to home in good condition.   I personally performed the services described in this documentation, which was scribed in my presence. The recorded information has been reviewed and is accurate.  BP 153/100 mmHg  Pulse 86  Temp(Src) 97.9 F (36.6 C) (Oral)  Resp 18  Ht 6' (1.829 m)  Wt 298 lb (135.172 kg)  BMI 40.41 kg/m2  SpO2 96%  No orders of the defined types were placed in this encounter.      Levette Paulick Camprubi-Soms, PA-C 06/04/15 1144  Courteney Randall An, MD 06/04/15 1541

## 2015-06-04 NOTE — ED Notes (Signed)
Patient states generalized pain that he goes to pain management for.   Patient states he has some narcotics left, "but nothing that I'm used to taking".   Patient wants additional narcotics.   Patient states he does go to pain management on Thursday.   "I want something to hold me over".

## 2015-06-04 NOTE — Telephone Encounter (Signed)
Patient walked into nurse clinic this morning request refill on pain medication.  Advised patient that he will need an appointment for pain medication refilled.  Patient has not been in clinic since 11/2014.  Patient stated that he moved away and now is back in [place].  He also stated that he has an appointment with a pain management clinic on Thursday 06/07/15.  Advised if he is in a lot of pain he could go to urgent care or ED, no appointments at [facility] today.  Clovis Pu, RN

## 2016-08-09 IMAGING — CT CT ABD-PELV W/ CM
2 of 5 series · 16 of 46 positions shown, 18 images · IV contrast (Omni 300)
Comparison: None.

CLINICAL DATA: Upper abdominal pain. Cirrhosis secondary to
hepatitis-C infection. Initial counter.

EXAM:
CT ABDOMEN AND PELVIS WITH CONTRAST
TECHNIQUE: Multidetector CT imaging of the abdomen and pelvis was performed
using the standard protocol following bolus administration of
intravenous contrast.
CONTRAST:  100mL OMNIPAQUE IOHEXOL 300 MG/ML  SOLN

[Series 2: abd/ pelvis 5.0 i30f 1 · axial · 0.97mm/px · z∈[-341,+119]mm · 13 of 104 slices shown, 15 images]
[im 6/104  soft-tissue]
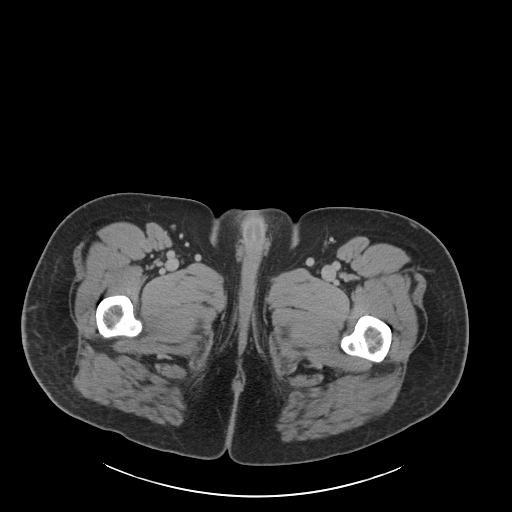
[im 6/104  bone]
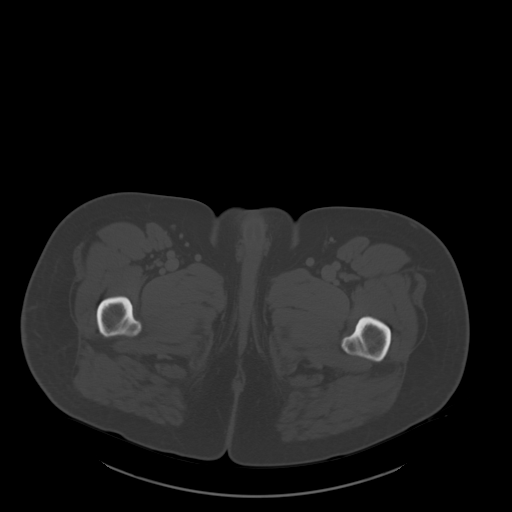
[im 17/104  soft-tissue]
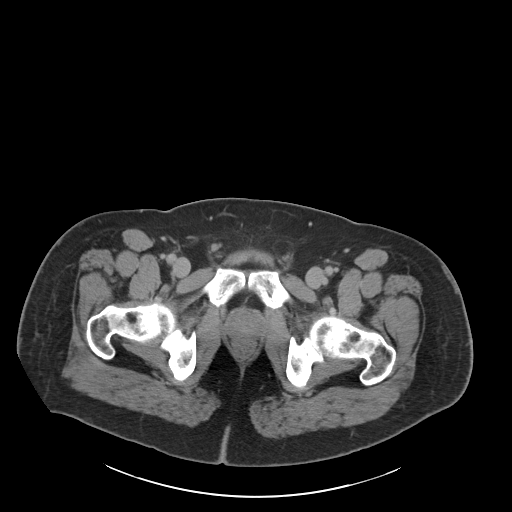
[im 22/104  soft-tissue]
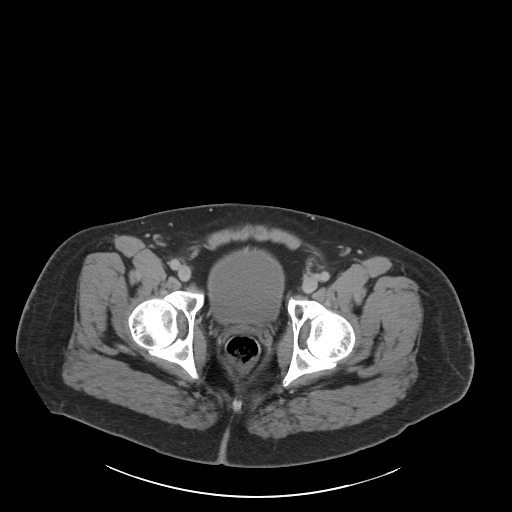
[im 28/104  soft-tissue]
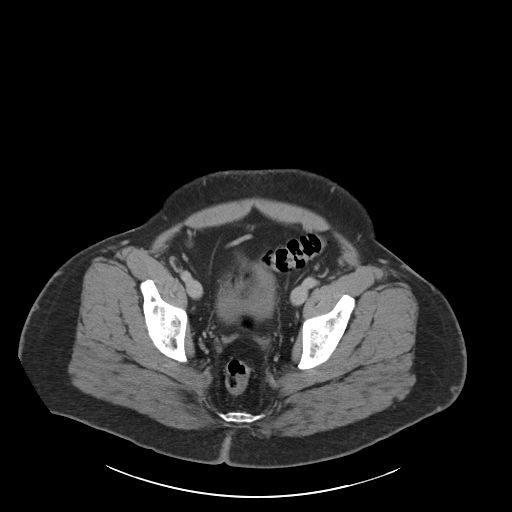
[im 38/104  soft-tissue]
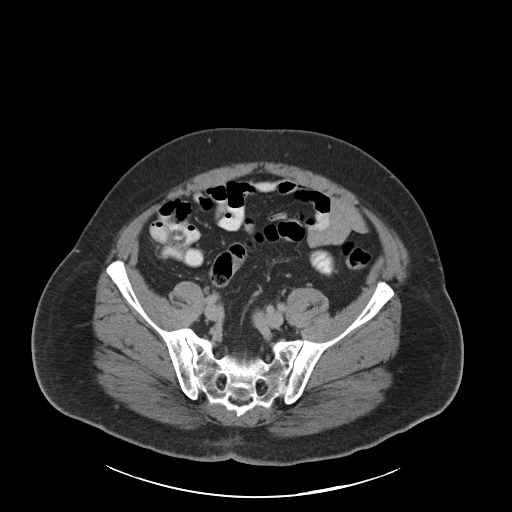
[im 44/104  soft-tissue]
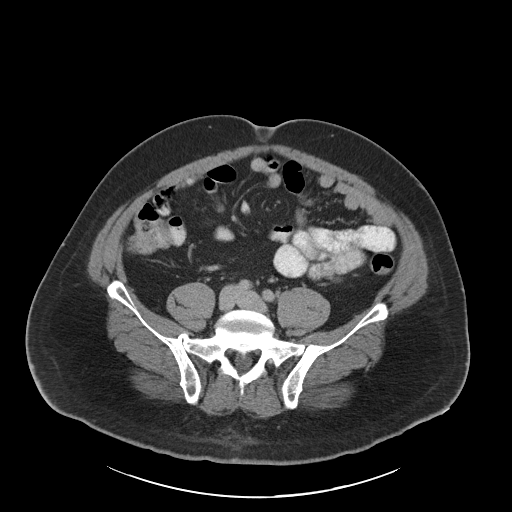
[im 55/104  soft-tissue]
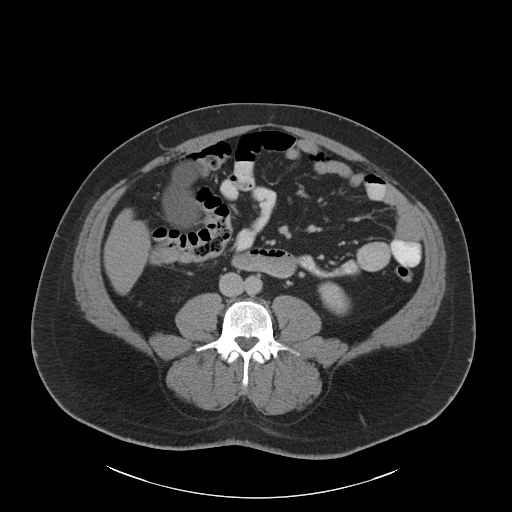
[im 60/104  soft-tissue]
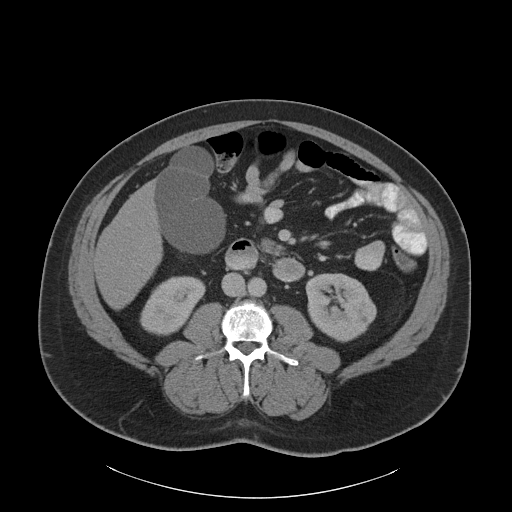
[im 66/104  soft-tissue]
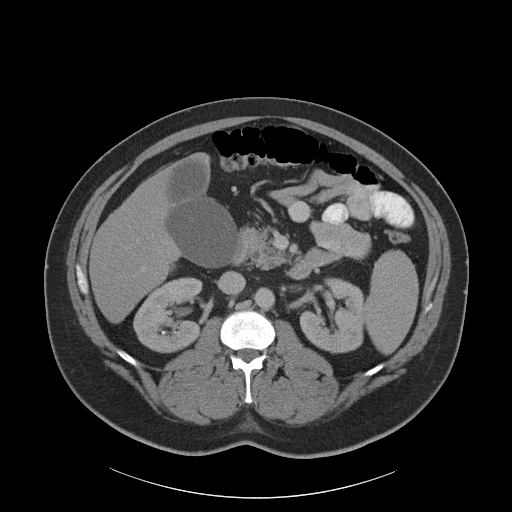
[im 66/104  bone]
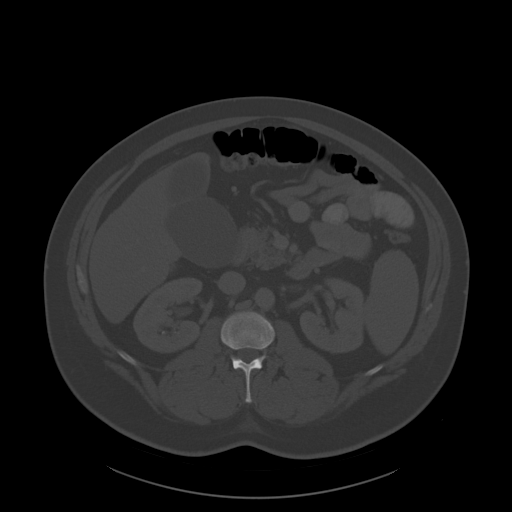
[im 76/104  soft-tissue]
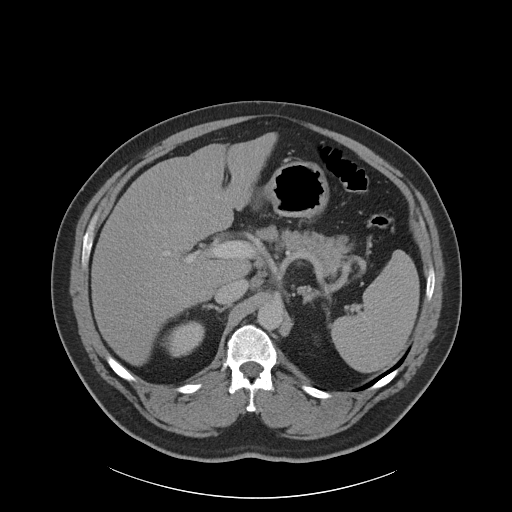
[im 82/104  soft-tissue]
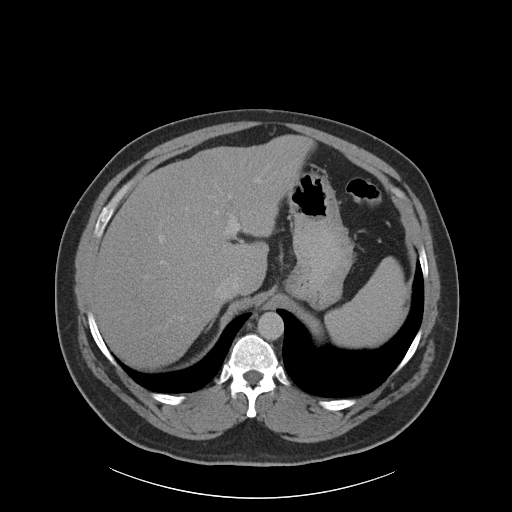
[im 87/104  soft-tissue]
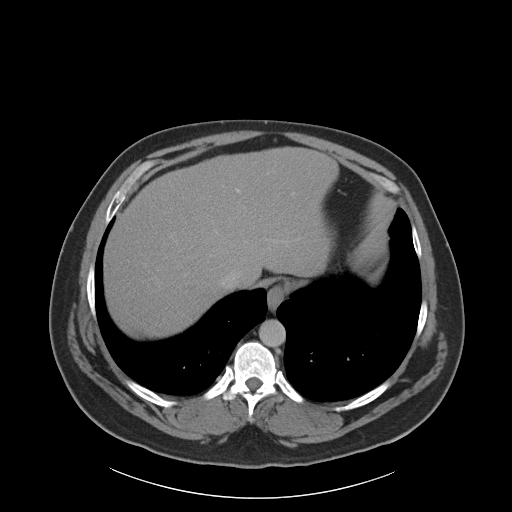
[im 98/104  soft-tissue]
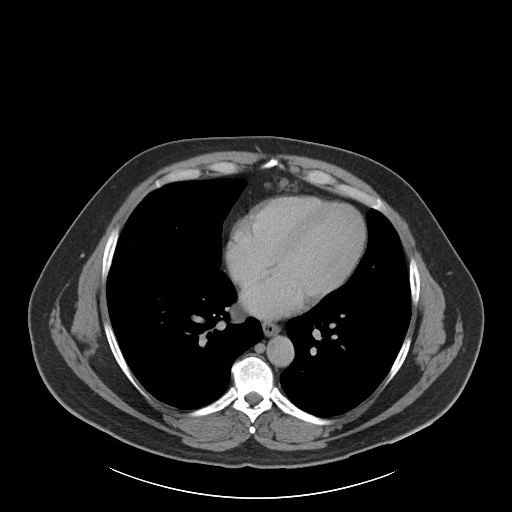

[Series 5: coronals · coronal · 0.89mm/px · 3 of 172 slices shown]
[im 58/172  soft-tissue]
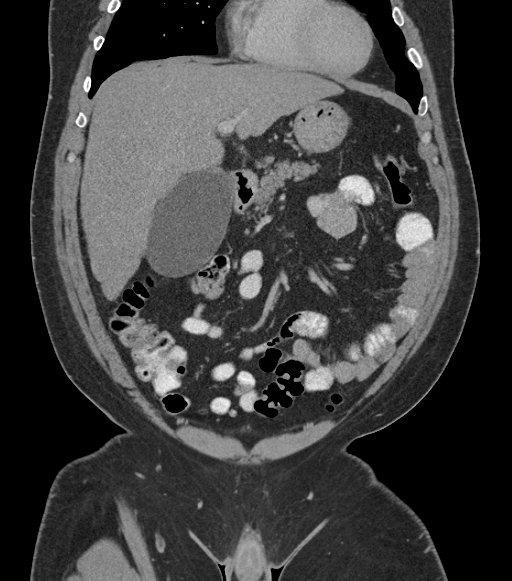
[im 77/172  soft-tissue]
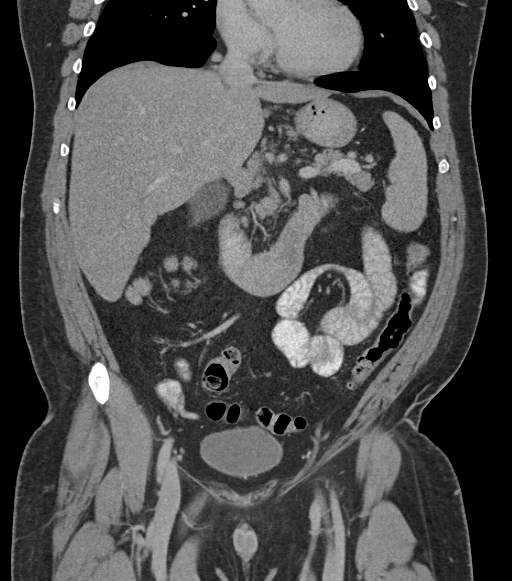
[im 96/172  soft-tissue]
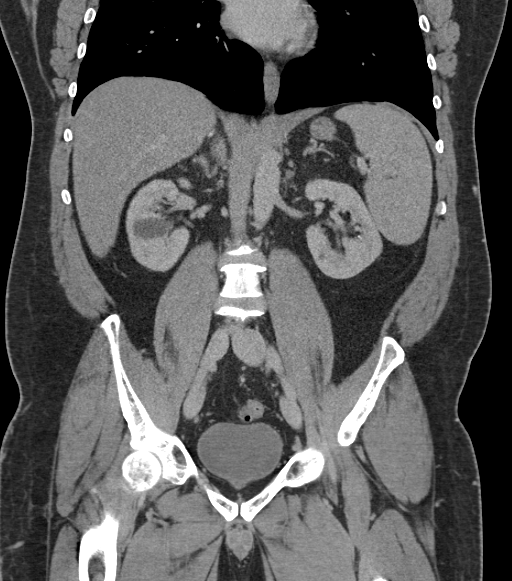

[16 of 46 positions shown; findings below may reference images not displayed]

FINDINGS: Lower chest:  Lung bases are clear.

Hepatobiliary: No focal hepatic lesion. The caudate lobe is mildly
enlarged. There is a fine lobular contour to the liver. No ascites.
The portal veins are patent. The splenic vein is patent. No
significant periportal lymphadenopathy.

The gallbladder is distended to 58 mm; however there is no
gallbladder inflammation evident. The common bile duct is mildly
dilated 8 mm.

Pancreas: Pancreas is normal. No ductal dilatation. No pancreatic
inflammation.

Spleen: Normal spleen.

Adrenals/urinary tract: Adrenal glands are normal. The bilateral
nonobstructing renal calculi. There are 8 calculi within the right
kidney ranging in size from 2-3 mm. There 6 calculi within the left
kidney ranging in size from 2-4 mm. No ureterolithiasis or
obstructive uropathy. There is a low-density right kidney which
appear benign.

The distal ureters are normal.  There are no bladder calculi.

Stomach/Bowel: The stomach, small bowel, appendix, and cecum are
normal. The colon and rectosigmoid colon are normal.

Vascular/Lymphatic: Abdominal aorta is normal caliber. Npo
retroperitoneal or periportal lymphadenopathy. No pelvic
lymphadenopathy. There several prominent lymph nodes in the
gastrohepatic mildly enlarged up to 10 mm.

Reproductive: Prostate gland is normal.  No pelvic lymphadenopathy.

Musculoskeletal: No aggressive osseous lesion.

Other: No free-fluid in after pelvis.  No peritoneal disease.
IMPRESSION: 1. Gallbladder is distended with mild common bile duct dilatation.
Consider right upper quadrant ultrasound or HIDA scan for further
evaluation for cholecystitis.
2. Bilateral nephrolithiasis without ureterolithiasis or
obstruction.
3. Several low-density lesions within the right kidney likely
represent benign cysts.
4. Mild  morphologic changes of cirrhosis of the liver.
5. Mild gastrohepatic ligament lymphadenopathy likely represents
related to cirrhosis.

## 2021-09-22 DEATH — deceased
